# Patient Record
Sex: Female | Born: 1950 | Race: Black or African American | Hispanic: No | Marital: Married | Smoking: Former smoker
Health system: Southern US, Community
[De-identification: ages and names within clinical notes are randomized; demographics above are authoritative.]

## PROBLEM LIST (undated history)

## (undated) HISTORY — PX: BREAST EXCISIONAL BIOPSY: SUR124

---

## 2004-04-01 ENCOUNTER — Ambulatory Visit: Payer: Self-pay | Admitting: Unknown Physician Specialty

## 2004-09-22 ENCOUNTER — Ambulatory Visit: Payer: Self-pay | Admitting: Family Medicine

## 2004-12-13 ENCOUNTER — Ambulatory Visit: Payer: Self-pay | Admitting: Family Medicine

## 2005-11-23 ENCOUNTER — Ambulatory Visit: Payer: Self-pay | Admitting: Family Medicine

## 2006-04-12 ENCOUNTER — Ambulatory Visit: Payer: Self-pay | Admitting: Family Medicine

## 2006-11-28 ENCOUNTER — Ambulatory Visit: Payer: Self-pay

## 2007-11-28 ENCOUNTER — Ambulatory Visit: Payer: Self-pay

## 2008-12-04 ENCOUNTER — Ambulatory Visit: Payer: Self-pay | Admitting: Family Medicine

## 2009-12-16 ENCOUNTER — Ambulatory Visit: Payer: Self-pay

## 2010-12-21 ENCOUNTER — Ambulatory Visit: Payer: Self-pay

## 2011-12-21 ENCOUNTER — Ambulatory Visit: Payer: Self-pay

## 2012-12-24 ENCOUNTER — Ambulatory Visit: Payer: Self-pay

## 2014-01-01 ENCOUNTER — Ambulatory Visit: Payer: Self-pay | Admitting: Nurse Practitioner

## 2014-09-03 ENCOUNTER — Encounter
Admission: RE | Disposition: A | Discharge status: Home / Self Care | Payer: Self-pay | Source: Ambulatory Visit | Attending: Unknown Physician Specialty

## 2014-09-03 ENCOUNTER — Ambulatory Visit: Payer: 59 | Admitting: Anesthesiology

## 2014-09-03 ENCOUNTER — Ambulatory Visit
Admission: RE | Admit: 2014-09-03 | Discharge: 2014-09-03 | Disposition: A | Discharge status: Home / Self Care | Payer: 59 | Source: Ambulatory Visit | Attending: Unknown Physician Specialty | Admitting: Unknown Physician Specialty

## 2014-09-03 ENCOUNTER — Encounter: Payer: Self-pay | Admitting: Anesthesiology

## 2014-09-03 DIAGNOSIS — D125 Benign neoplasm of sigmoid colon: Secondary | ICD-10-CM | POA: Diagnosis not present

## 2014-09-03 DIAGNOSIS — K648 Other hemorrhoids: Secondary | ICD-10-CM | POA: Diagnosis not present

## 2014-09-03 DIAGNOSIS — Z87891 Personal history of nicotine dependence: Secondary | ICD-10-CM | POA: Diagnosis not present

## 2014-09-03 DIAGNOSIS — Z1211 Encounter for screening for malignant neoplasm of colon: Secondary | ICD-10-CM | POA: Insufficient documentation

## 2014-09-03 DIAGNOSIS — D123 Benign neoplasm of transverse colon: Secondary | ICD-10-CM | POA: Insufficient documentation

## 2014-09-03 HISTORY — PX: COLONOSCOPY WITH PROPOFOL: SHX5780

## 2014-09-03 SURGERY — COLONOSCOPY WITH PROPOFOL
Anesthesia: General

## 2014-09-03 MED ORDER — MIDAZOLAM HCL 5 MG/5ML IJ SOLN
INTRAMUSCULAR | Status: DC | PRN
  Administered 2014-09-03: 1 mg via INTRAVENOUS

## 2014-09-03 MED ORDER — PROPOFOL 10 MG/ML IV BOLUS
INTRAVENOUS | Status: DC | PRN
  Administered 2014-09-03: 30 mg via INTRAVENOUS

## 2014-09-03 MED ORDER — SODIUM CHLORIDE 0.9 % IV SOLN: INTRAVENOUS | Status: DC

## 2014-09-03 MED ORDER — PROPOFOL INFUSION 10 MG/ML OPTIME
INTRAVENOUS | Status: DC | PRN
  Administered 2014-09-03: 160 ug/kg/min via INTRAVENOUS

## 2014-09-03 MED ORDER — LIDOCAINE HCL (PF) 2 % IJ SOLN
INTRAMUSCULAR | Status: DC | PRN
  Administered 2014-09-03: 50 mg

## 2014-09-03 MED ORDER — FENTANYL CITRATE (PF) 100 MCG/2ML IJ SOLN
INTRAMUSCULAR | Status: DC | PRN
  Administered 2014-09-03: 50 ug via INTRAVENOUS

## 2014-09-03 MED ORDER — EPHEDRINE SULFATE 50 MG/ML IJ SOLN
INTRAMUSCULAR | Status: DC | PRN
  Administered 2014-09-03: 15 mg via INTRAVENOUS
  Administered 2014-09-03: 10 mg via INTRAVENOUS

## 2014-09-03 MED ORDER — SODIUM CHLORIDE 0.9 % IV SOLN
INTRAVENOUS | Status: DC
  Administered 2014-09-03: 13:00:00 via INTRAVENOUS

## 2014-09-03 NOTE — Anesthesia Postprocedure Evaluation (Signed)
  Anesthesia Post-op Note  Patient: Alisha Henson  Procedure(s) Performed: Procedure(s): COLONOSCOPY WITH PROPOFOL (N/A)  Anesthesia type:General  Patient location: PACU  Post pain: Pain level controlled  Post assessment: Post-op Vital signs reviewed, Patient's Cardiovascular Status Stable, Respiratory Function Stable, Patent Airway and No signs of Nausea or vomiting  Post vital signs: Reviewed and stable  Last Vitals:  Filed Vitals:   09/03/14 1430  BP: 125/57  Pulse: 70  Temp:   Resp: 17    Level of consciousness: awake, alert  and patient cooperative  Complications: No apparent anesthesia complications

## 2014-09-03 NOTE — Anesthesia Preprocedure Evaluation (Signed)
Anesthesia Evaluation  Patient identified by MRN, date of birth, ID band Patient awake    Reviewed: Allergy & Precautions, NPO status , Patient's Chart, lab work & pertinent test results, reviewed documented beta blocker date and time   Airway Mallampati: II  TM Distance: >3 FB     Dental  (+) Chipped   Pulmonary          Cardiovascular     Neuro/Psych    GI/Hepatic   Endo/Other    Renal/GU      Musculoskeletal   Abdominal   Peds  Hematology   Anesthesia Other Findings   Reproductive/Obstetrics                             Anesthesia Physical Anesthesia Plan  ASA: II  Anesthesia Plan: General   Post-op Pain Management:    Induction: Intravenous  Airway Management Planned: Nasal Cannula  Additional Equipment:   Intra-op Plan:   Post-operative Plan:   Informed Consent: I have reviewed the patients History and Physical, chart, labs and discussed the procedure including the risks, benefits and alternatives for the proposed anesthesia with the patient or authorized representative who has indicated his/her understanding and acceptance.     Plan Discussed with: CRNA  Anesthesia Plan Comments:         Anesthesia Quick Evaluation

## 2014-09-03 NOTE — H&P (Signed)
Primary Care Physician:  Marguerita Merles, MD Primary Gastroenterologist:  Dr. Vira Agar  Pre-Procedure History & Physical: HPI:  Alisha Henson is a 63 y.o. female is here for an colonoscopy.   History reviewed. No pertinent past medical history.  History reviewed. No pertinent past surgical history.  Prior to Admission medications   Not on File    Allergies as of 08/06/2014  . (Not on File)    History reviewed. No pertinent family history.  History   Social History  . Marital Status: Married    Spouse Name: N/A  . Number of Children: N/A  . Years of Education: N/A   Occupational History  . Not on file.   Social History Main Topics  . Smoking status: Former Smoker    Quit date: 09/03/1978  . Smokeless tobacco: Not on file  . Alcohol Use: Not on file  . Drug Use: Not on file  . Sexual Activity: Not on file   Other Topics Concern  . Not on file   Social History Narrative  . No narrative on file    Review of Systems: See HPI, otherwise negative ROS  Physical Exam: BP 153/74 mmHg  Pulse 72  Temp(Src) 98 F (36.7 C)  Resp 20  SpO2 100% General:   Alert,  pleasant and cooperative in NAD Head:  Normocephalic and atraumatic. Neck:  Supple; no masses or thyromegaly. Lungs:  Clear throughout to auscultation.    Heart:  Regular rate and rhythm. Abdomen:  Soft, nontender and nondistended. Normal bowel sounds, without guarding, and without rebound.   Neurologic:  Alert and  oriented x4;  grossly normal neurologically.  Impression/Plan: Alisha Henson is here for an colonoscopy to be performed for screening  Risks, benefits, limitations, and alternatives regarding  colonoscopy have been reviewed with the patient.  Questions have been answered.  All parties agreeable.   Gaylyn Cheers, MD  09/03/2014, 1:19 PM   Primary Care Physician:  Marguerita Merles, MD Primary Gastroenterologist:  Dr. Vira Agar  Pre-Procedure History & Physical: HPI:  Alisha Henson is a 64  y.o. female is here for an colonoscopy.   History reviewed. No pertinent past medical history.  History reviewed. No pertinent past surgical history.  Prior to Admission medications   Not on File    Allergies as of 08/06/2014  . (Not on File)    History reviewed. No pertinent family history.  History   Social History  . Marital Status: Married    Spouse Name: N/A  . Number of Children: N/A  . Years of Education: N/A   Occupational History  . Not on file.   Social History Main Topics  . Smoking status: Former Smoker    Quit date: 09/03/1978  . Smokeless tobacco: Not on file  . Alcohol Use: Not on file  . Drug Use: Not on file  . Sexual Activity: Not on file   Other Topics Concern  . Not on file   Social History Narrative  . No narrative on file    Review of Systems: See HPI, otherwise negative ROS  Physical Exam: BP 153/74 mmHg  Pulse 72  Temp(Src) 98 F (36.7 C)  Resp 20  SpO2 100% General:   Alert,  pleasant and cooperative in NAD Head:  Normocephalic and atraumatic. Neck:  Supple; no masses or thyromegaly. Lungs:  Clear throughout to auscultation.    Heart:  Regular rate and rhythm. Abdomen:  Soft, nontender and nondistended. Normal bowel sounds, without guarding, and without  rebound.   Neurologic:  Alert and  oriented x4;  grossly normal neurologically.  Impression/Plan: Alisha Henson is here for an colonoscopy to be performed for screening  Risks, benefits, limitations, and alternatives regarding  colonoscopy have been reviewed with the patient.  Questions have been answered.  All parties agreeable.   Gaylyn Cheers, MD  09/03/2014, 1:19 PM

## 2014-09-03 NOTE — Op Note (Signed)
[facility] Gastroenterology Patient Name: Alisha Henson Procedure Date: 09/03/2014 1:11 PM MRN: [id number] Account #: [id number] Date of Birth: 1950-09-28 Admit Type: Outpatient Age: 64 Room: [facility] ENDO ROOM 4 Gender: Female Note Status: Finalized Procedure:         Colonoscopy Indications:       Screening for colorectal malignant neoplasm Providers:         Manya Silvas, MD Referring MD:      Marguerita Merles, MD (Referring MD) Medicines:         Propofol per Anesthesia Complications:     No immediate complications. Procedure:         Pre-Anesthesia Assessment:                    - After reviewing the risks and benefits, the patient was                     deemed in satisfactory condition to undergo the procedure.                    After obtaining informed consent, the colonoscope was                     passed under direct vision. Throughout the procedure, the                     patient's blood pressure, pulse, and oxygen saturations                     were monitored continuously. The Olympus PCF-H180AL                     colonoscope ( S#: [id number] ) was introduced through the                     anus and advanced to the the cecum, identified by                     appendiceal orifice and ileocecal valve. The colonoscopy                     was performed without difficulty. The patient tolerated                     the procedure well. The quality of the bowel preparation                     was excellent. Findings:      A small polyp was found in the transverse colon. The polyp was sessile.       The polyp was removed with a hot snare. Resection and retrieval were       complete.      A small medium polyp was found in the proximal sigmoid colon. The polyp       was sessile. The polyp was removed with a hot snare. Resection and       retrieval were complete.      Internal hemorrhoids were found. The hemorrhoids were medium-sized.      The exam was  otherwise without abnormality. Impression:        - One small polyp in the transverse colon. Resected and                     retrieved.                    -  One medium polyp in the proximal sigmoid colon. Resected                     and retrieved.                    - Internal hemorrhoids.                    - The examination was otherwise normal. Recommendation:    - Await pathology results. Manya Silvas, MD 09/03/2014 1:54:49 PM This report has been signed electronically. Number of Addenda: 0 Note Initiated On: 09/03/2014 1:11 PM Scope Withdrawal Time: 0 hours 15 minutes 12 seconds  Total Procedure Duration: 0 hours 22 minutes 54 seconds       [facility]

## 2014-09-03 NOTE — Transfer of Care (Signed)
Immediate Anesthesia Transfer of Care Note  Patient: Alisha Henson  Procedure(s) Performed: Procedure(s): COLONOSCOPY WITH PROPOFOL (N/A)  Patient Location: PACU  Anesthesia Type:General  Level of Consciousness: sedated  Airway & Oxygen Therapy: Patient Spontanous Breathing and Patient connected to nasal cannula oxygen  Post-op Assessment: Report given to RN and Post -op Vital signs reviewed and stable  Post vital signs: Reviewed, stable  Last Vitals:  Filed Vitals:   09/03/14 1259  BP: 153/74  Pulse: 72  Temp: 36.7 C  Resp: 20    Complications: No apparent anesthesia complications

## 2014-09-04 LAB — SURGICAL PATHOLOGY

## 2014-09-08 ENCOUNTER — Encounter: Payer: Self-pay | Admitting: Unknown Physician Specialty

## 2014-12-23 ENCOUNTER — Other Ambulatory Visit: Payer: Self-pay | Admitting: Family Medicine

## 2014-12-23 DIAGNOSIS — Z1231 Encounter for screening mammogram for malignant neoplasm of breast: Secondary | ICD-10-CM

## 2015-01-05 ENCOUNTER — Ambulatory Visit
Admission: RE | Admit: 2015-01-05 | Discharge: 2015-01-05 | Disposition: A | Discharge status: Home / Self Care | Payer: Medicare HMO | Source: Ambulatory Visit | Attending: Family Medicine | Admitting: Family Medicine

## 2015-01-05 ENCOUNTER — Other Ambulatory Visit: Payer: Self-pay | Admitting: Family Medicine

## 2015-01-05 DIAGNOSIS — Z1231 Encounter for screening mammogram for malignant neoplasm of breast: Secondary | ICD-10-CM | POA: Insufficient documentation

## 2015-05-14 ENCOUNTER — Other Ambulatory Visit: Payer: Self-pay | Admitting: Nurse Practitioner

## 2015-11-30 ENCOUNTER — Other Ambulatory Visit: Payer: Self-pay | Admitting: Family Medicine

## 2015-11-30 DIAGNOSIS — Z1231 Encounter for screening mammogram for malignant neoplasm of breast: Secondary | ICD-10-CM

## 2016-01-06 ENCOUNTER — Ambulatory Visit: Payer: Medicare HMO

## 2016-02-16 ENCOUNTER — Ambulatory Visit
Admission: RE | Admit: 2016-02-16 | Discharge: 2016-02-16 | Disposition: A | Discharge status: Home / Self Care | Payer: Medicare HMO | Source: Ambulatory Visit | Attending: Family Medicine | Admitting: Family Medicine

## 2016-02-16 DIAGNOSIS — Z1231 Encounter for screening mammogram for malignant neoplasm of breast: Secondary | ICD-10-CM | POA: Diagnosis not present

## 2016-07-12 ENCOUNTER — Other Ambulatory Visit: Payer: Self-pay | Admitting: Family Medicine

## 2016-07-12 DIAGNOSIS — Z13820 Encounter for screening for osteoporosis: Secondary | ICD-10-CM

## 2016-09-01 ENCOUNTER — Other Ambulatory Visit: Payer: Medicare HMO

## 2016-10-24 ENCOUNTER — Ambulatory Visit
Admission: RE | Admit: 2016-10-24 | Discharge: 2016-10-24 | Disposition: A | Discharge status: Home / Self Care | Payer: Medicare HMO | Source: Ambulatory Visit | Attending: Family Medicine | Admitting: Family Medicine

## 2016-10-24 DIAGNOSIS — M8588 Other specified disorders of bone density and structure, other site: Secondary | ICD-10-CM | POA: Diagnosis not present

## 2016-10-24 DIAGNOSIS — Z13820 Encounter for screening for osteoporosis: Secondary | ICD-10-CM | POA: Diagnosis not present

## 2017-02-03 ENCOUNTER — Other Ambulatory Visit: Payer: Self-pay | Admitting: Family Medicine

## 2017-03-28 ENCOUNTER — Other Ambulatory Visit: Payer: Self-pay | Admitting: Family Medicine

## 2017-03-28 DIAGNOSIS — Z1231 Encounter for screening mammogram for malignant neoplasm of breast: Secondary | ICD-10-CM

## 2017-04-14 ENCOUNTER — Ambulatory Visit
Admission: RE | Admit: 2017-04-14 | Discharge: 2017-04-14 | Disposition: A | Discharge status: Home / Self Care | Payer: Medicare HMO | Source: Ambulatory Visit | Attending: Family Medicine | Admitting: Family Medicine

## 2017-04-14 DIAGNOSIS — Z1231 Encounter for screening mammogram for malignant neoplasm of breast: Secondary | ICD-10-CM | POA: Insufficient documentation

## 2017-05-23 ENCOUNTER — Ambulatory Visit
Admission: RE | Admit: 2017-05-23 | Discharge: 2017-05-23 | Disposition: A | Discharge status: Home / Self Care | Payer: Medicare HMO | Source: Ambulatory Visit | Attending: Nurse Practitioner | Admitting: Nurse Practitioner

## 2017-05-23 ENCOUNTER — Other Ambulatory Visit: Payer: Self-pay | Admitting: Nurse Practitioner

## 2017-05-23 DIAGNOSIS — M25562 Pain in left knee: Secondary | ICD-10-CM | POA: Insufficient documentation

## 2018-03-15 ENCOUNTER — Other Ambulatory Visit: Payer: Self-pay | Admitting: Family Medicine

## 2018-03-15 DIAGNOSIS — Z1231 Encounter for screening mammogram for malignant neoplasm of breast: Secondary | ICD-10-CM

## 2018-07-24 ENCOUNTER — Ambulatory Visit
Admission: RE | Admit: 2018-07-24 | Discharge: 2018-07-24 | Disposition: A | Discharge status: Home / Self Care | Payer: Medicare HMO | Source: Ambulatory Visit | Attending: Family Medicine | Admitting: Family Medicine

## 2018-07-24 ENCOUNTER — Other Ambulatory Visit: Payer: Self-pay

## 2018-07-24 DIAGNOSIS — Z1231 Encounter for screening mammogram for malignant neoplasm of breast: Secondary | ICD-10-CM | POA: Diagnosis not present

## 2019-03-27 ENCOUNTER — Other Ambulatory Visit: Payer: Self-pay

## 2019-03-27 ENCOUNTER — Ambulatory Visit: Payer: Medicare HMO | Admitting: Urology

## 2019-03-27 ENCOUNTER — Encounter: Payer: Self-pay | Admitting: Urology

## 2019-03-27 VITALS — BP 155/84 | HR 92 | Ht 66.0 in | Wt 212.0 lb

## 2019-03-27 DIAGNOSIS — R350 Frequency of micturition: Secondary | ICD-10-CM

## 2019-03-27 DIAGNOSIS — R3129 Other microscopic hematuria: Secondary | ICD-10-CM | POA: Diagnosis not present

## 2019-03-27 LAB — URINALYSIS, COMPLETE
Bilirubin, UA: NEGATIVE
Glucose, UA: NEGATIVE
Leukocytes,UA: NEGATIVE
Nitrite, UA: NEGATIVE
Protein,UA: NEGATIVE
Specific Gravity, UA: 1.025 (ref 1.005–1.030)
Urobilinogen, Ur: 0.2 mg/dL (ref 0.2–1.0)
pH, UA: 5.5 (ref 5.0–7.5)

## 2019-03-27 LAB — MICROSCOPIC EXAMINATION: Bacteria, UA: NONE SEEN

## 2019-03-27 LAB — BLADDER SCAN AMB NON-IMAGING: SCA Result: 0

## 2019-03-27 MED ORDER — OXYBUTYNIN CHLORIDE ER 10 MG PO TB24: 10.0000 mg | ORAL_TABLET | Freq: Every day | ORAL | 1 refills | Status: DC

## 2019-03-27 NOTE — Progress Notes (Signed)
03/27/2019 4:56 PM   Alisha Henson 01-24-1951 [id number]  Referring provider: Bunnie Pion, [place],  [place] [address]  Chief Complaint  Patient presents with  . Urinary Frequency    HPI: Alisha Henson is a 68 y.o. female seen at the request of Hendricks Milo, FNP for evaluation of lower urinary tract symptoms and microhematuria.  She was seen on 02/26/2019 complaining of a several month history of urinary frequency and nocturia x3.  She also complained of urgency with occasional episodes of urge incontinence and mild stress incontinence.  She states she was diagnosed with overactive bladder several years ago but had never been treated.  Her urinalysis showed moderate blood but no microscopy was performed.  She was started on extended release oxybutynin 5 mg daily and states this has not improved her symptoms.  Denies gross hematuria.  She has mild suprapubic discomfort.  Denies chronic constipation or diarrhea.  No urologic history including CVA, Parkinson's or MS.   PMH: No past medical history on file.  Surgical History: Past Surgical History:  Procedure Laterality Date  . BREAST EXCISIONAL BIOPSY Right 20+ yrs ago   neg  . COLONOSCOPY WITH PROPOFOL N/A 09/03/2014   Procedure: COLONOSCOPY WITH PROPOFOL;  Surgeon: Manya Silvas, MD;  Location: [facility] ENDOSCOPY;  Service: Endoscopy;  Laterality: N/A;    Home Medications:  Allergies as of 03/27/2019   No Known Allergies     Medication List       Accurate as of March 27, 2019  4:56 PM. If you have any questions, ask your nurse or doctor.        amLODipine 10 MG tablet Commonly known as: NORVASC   fluticasone 50 MCG/ACT nasal spray Commonly known as: FLONASE   lovastatin 20 MG tablet Commonly known as: MEVACOR   oxybutynin 5 MG tablet Commonly known as: DITROPAN       Allergies: No Known Allergies  Family History: Family History  Problem Relation Age of Onset  . Breast cancer Neg  Hx     Social History:  reports that she quit smoking about 40 years ago. She has never used smokeless tobacco. She reports that she does not drink alcohol. No history on file for drug.  ROS: UROLOGY Frequent Urination?: No Hard to postpone urination?: No Burning/pain with urination?: No Get up at night to urinate?: Yes Leakage of urine?: Yes Urine stream starts and stops?: Yes Trouble starting stream?: No Do you have to strain to urinate?: No Blood in urine?: Yes Urinary tract infection?: Yes Sexually transmitted disease?: No Injury to kidneys or bladder?: No Painful intercourse?: No Weak stream?: No Currently pregnant?: No Vaginal bleeding?: No Last menstrual period?: n  Gastrointestinal Nausea?: No Vomiting?: No Indigestion/heartburn?: No Diarrhea?: No Constipation?: No  Constitutional Fever: No Night sweats?: No Weight loss?: No Fatigue?: No  Skin Skin rash/lesions?: No Itching?: No  Eyes Blurred vision?: No Double vision?: No  Ears/Nose/Throat Sore throat?: No Sinus problems?: No  Hematologic/Lymphatic Swollen glands?: No Easy bruising?: No  Cardiovascular Leg swelling?: No Chest pain?: No  Respiratory Cough?: No Shortness of breath?: No  Endocrine Excessive thirst?: No  Musculoskeletal Back pain?: No Joint pain?: No  Neurological Headaches?: No Dizziness?: No  Psychologic Depression?: No Anxiety?: No  Physical Exam: BP (!) 155/84   Pulse 92   Ht 5\' 6"  (1.676 m)   Wt 212 lb (96.2 kg)   BMI 34.22 kg/m   Constitutional:  Alert and oriented, No acute distress.  HEENT: [place] AT, moist mucus membranes.  Trachea midline, no masses. Cardiovascular: No clubbing, cyanosis, or edema. Respiratory: Normal respiratory effort, no increased work of breathing. GI: Abdomen is soft, mild suprapubic tenderness, nondistended, no abdominal masses GU: No CVA tenderness Lymph: No cervical or inguinal lymphadenopathy. Skin: No rashes, bruises or  suspicious lesions. Neurologic: Grossly intact, no focal deficits, moving all 4 extremities. Psychiatric: Normal mood and affect.  Laboratory Data:  Urinalysis Dipstick 1+ blood Microscopy 3-10 RBC  Assessment & Plan:    - Overactive bladder with urge incontinence Will increase oxybutynin to 10 mg  - Microhematuria Microscopic exam today does show clinically significant microhematuria.  Based on age she does have high risk microhematuria.  We discussed the recommended evaluation including CT urogram and cystoscopy.  She desires to proceed    Abbie Sons, MD  [place] [address] [place], [place] [address] [phone]

## 2019-03-29 ENCOUNTER — Encounter: Payer: Self-pay | Admitting: Urology

## 2019-04-17 ENCOUNTER — Ambulatory Visit
Admission: RE | Admit: 2019-04-17 | Discharge: 2019-04-17 | Disposition: A | Discharge status: Home / Self Care | Payer: Medicare HMO | Source: Ambulatory Visit | Attending: Urology | Admitting: Urology

## 2019-04-17 ENCOUNTER — Other Ambulatory Visit: Payer: Self-pay

## 2019-04-17 DIAGNOSIS — R3129 Other microscopic hematuria: Secondary | ICD-10-CM | POA: Diagnosis not present

## 2019-04-17 LAB — POCT I-STAT CREATININE: Creatinine, Ser: 0.8 mg/dL (ref 0.44–1.00)

## 2019-04-17 MED ORDER — IOHEXOL 300 MG/ML  SOLN
125.0000 mL | Freq: Once | INTRAMUSCULAR | Status: AC | PRN
  Administered 2019-04-17: 125 mL via INTRAVENOUS

## 2019-04-19 ENCOUNTER — Other Ambulatory Visit: Payer: Self-pay

## 2019-04-19 ENCOUNTER — Ambulatory Visit: Payer: Medicare HMO | Admitting: Urology

## 2019-04-19 ENCOUNTER — Encounter: Payer: Self-pay | Admitting: Urology

## 2019-04-19 VITALS — BP 144/78 | HR 102 | Ht 66.0 in | Wt 212.0 lb

## 2019-04-19 DIAGNOSIS — R3129 Other microscopic hematuria: Secondary | ICD-10-CM | POA: Diagnosis not present

## 2019-04-19 LAB — MICROSCOPIC EXAMINATION
Bacteria, UA: NONE SEEN
Epithelial Cells (non renal): NONE SEEN /hpf (ref 0–10)

## 2019-04-19 LAB — URINALYSIS, COMPLETE
Bilirubin, UA: NEGATIVE
Glucose, UA: NEGATIVE
Ketones, UA: NEGATIVE
Leukocytes,UA: NEGATIVE
Nitrite, UA: NEGATIVE
Protein,UA: NEGATIVE
Specific Gravity, UA: >1.03 — ABNORMAL HIGH (ref 1.005–1.030)
Urobilinogen, Ur: 0.2 mg/dL (ref 0.2–1.0)
pH, UA: 6.5 (ref 5.0–7.5)

## 2019-04-19 LAB — BLADDER SCAN AMB NON-IMAGING: SCA Result: 0

## 2019-04-19 NOTE — Progress Notes (Signed)
   04/19/19  CC:  Chief Complaint  Patient presents with  . Cysto   Indications: Microhematuria   HPI: 69 y.o. female seen 03/27/2019 for lower urinary tract symptoms and microhematuria.  She had been on oxybutynin 5 mg which was increased to 10 mg for OAB symptoms however could not tolerate secondary to dryness.  She did no improvement in her symptoms on the higher dose.  CTU performed on 04/17/2019 showed no upper tract abnormalities.  On my review there was fullness of the renal pelvis with bladder fullness.  PVR by bladder scan today was 0 mL.   Blood pressure (!) 144/78, pulse (!) 102, height 5\' 6"  (1.676 m), weight 212 lb (96.2 kg). NED. A&Ox3.   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  Lidocaine jelly was administered per urethral meatus.    Procedure: - Flexible cystoscope introduced, without any difficulty.   - Thorough search of the bladder revealed:    normal urethral meatus    normal urothelium    no stones    no ulcers     no tumors    no urethral polyps    no trabeculation  - Ureteral orifices were normal in position and appearance.  Post-Procedure: - Patient tolerated the procedure well  Assessment/ Plan: -No upper tract abnormalities CTU -No lower tract abnormalities on cystoscopy -Trial Toviaz 4 mg daily for OAB symptoms -Instructed to discontinue and call if she has side effects -Follow-up 6 months   Abbie Sons, MD

## 2019-05-09 ENCOUNTER — Other Ambulatory Visit: Payer: Self-pay

## 2019-05-09 MED ORDER — FESOTERODINE FUMARATE ER 4 MG PO TB24: 4.0000 mg | ORAL_TABLET | Freq: Every day | ORAL | 11 refills | Status: DC

## 2019-05-09 NOTE — Telephone Encounter (Signed)
Patient called stating that toviaz samples are working well for her she requested a script be sent to her pharmacy

## 2019-06-13 ENCOUNTER — Telehealth: Payer: Self-pay

## 2019-06-13 MED ORDER — MIRABEGRON ER 25 MG PO TB24: 25.0000 mg | ORAL_TABLET | Freq: Every day | ORAL | 1 refills | Status: DC

## 2019-06-13 NOTE — Telephone Encounter (Signed)
Patient called stating that the Toviaz samples are causing her to be constipated and she is having bad dry mouth. Can another medication be sent in?

## 2019-06-13 NOTE — Telephone Encounter (Signed)
Patient notified, she states she thought about it more and would like to hold off on trying any additional medication. She states she will let us know if she changes her mind

## 2019-06-13 NOTE — Telephone Encounter (Signed)
It looks like Myrbetriq is on her formulary.  Rx was sent

## 2019-06-28 ENCOUNTER — Other Ambulatory Visit: Payer: Self-pay | Admitting: Family Medicine

## 2019-06-28 DIAGNOSIS — Z1231 Encounter for screening mammogram for malignant neoplasm of breast: Secondary | ICD-10-CM

## 2019-07-25 ENCOUNTER — Ambulatory Visit
Admission: RE | Admit: 2019-07-25 | Discharge: 2019-07-25 | Disposition: A | Discharge status: Home / Self Care | Payer: Medicare HMO | Source: Ambulatory Visit | Attending: Family Medicine | Admitting: Family Medicine

## 2019-07-25 DIAGNOSIS — Z1231 Encounter for screening mammogram for malignant neoplasm of breast: Secondary | ICD-10-CM | POA: Diagnosis not present

## 2019-08-07 ENCOUNTER — Other Ambulatory Visit: Payer: Self-pay

## 2019-08-07 ENCOUNTER — Ambulatory Visit (INDEPENDENT_AMBULATORY_CARE_PROVIDER_SITE_OTHER): Payer: Medicare HMO | Admitting: Urology

## 2019-08-07 ENCOUNTER — Encounter: Payer: Self-pay | Admitting: Urology

## 2019-08-07 VITALS — BP 158/65 | HR 93 | Ht 66.0 in | Wt 200.0 lb

## 2019-08-07 DIAGNOSIS — R3129 Other microscopic hematuria: Secondary | ICD-10-CM | POA: Diagnosis not present

## 2019-08-07 NOTE — Progress Notes (Signed)
   08/07/2019 1:47 PM   Alisha Henson Oct 04, 1950 [id number]  Referring provider: Marguerita Merles, [place],  [place] [address] Chief Complaint  Patient presents with  . Urinary Frequency    HPI: Alisha Henson is a 69 y.o. female who presents today for a follow up. -Follow up for overactive bladder. She is no long on Toviaz or myrbetriq - Complains of frequency, urgency, nocturia x3 and dribbling at the end of urination -Denies dysuria, gross hematuria -Denies flank, abdominal or pelvic pain -Prior negative microscopic hematuria evaluation   PMH: History reviewed. No pertinent past medical history.  Surgical History: Past Surgical History:  Procedure Laterality Date  . BREAST EXCISIONAL BIOPSY Right 20+ yrs ago   neg  . COLONOSCOPY WITH PROPOFOL N/A 09/03/2014   Procedure: COLONOSCOPY WITH PROPOFOL;  Surgeon: Manya Silvas, MD;  Location: [facility] ENDOSCOPY;  Service: Endoscopy;  Laterality: N/A;    Home Medications:  Allergies as of 08/07/2019   No Known Allergies     Medication List       Accurate as of August 07, 2019  1:47 PM. If you have any questions, ask your nurse or doctor.        amLODipine 10 MG tablet Commonly known as: NORVASC   fesoterodine 4 MG Tb24 tablet Commonly known as: TOVIAZ Take 1 tablet (4 mg total) by mouth daily.   fluticasone 50 MCG/ACT nasal spray Commonly known as: FLONASE   lovastatin 20 MG tablet Commonly known as: MEVACOR   mirabegron ER 25 MG Tb24 tablet Commonly known as: MYRBETRIQ Take 1 tablet (25 mg total) by mouth daily.   Premarin vaginal cream Generic drug: conjugated estrogens       Allergies: No Known Allergies  Family History: Family History  Problem Relation Age of Onset  . Breast cancer Neg Hx     Social History:  reports that she quit smoking about 40 years ago. She has never used smokeless tobacco. She reports that she does not drink alcohol. No history on file for drug  use.   Physical Exam: BP (!) 158/65   Pulse 93   Ht 5\' 6"  (1.676 m)   Wt 200 lb (90.7 kg)   BMI 32.28 kg/m   Constitutional:  Alert and oriented, No acute distress. HEENT: [place] AT, moist mucus membranes.  Trachea midline, no masses. Cardiovascular: No clubbing, cyanosis, or edema. Respiratory: Normal respiratory effort, no increased work of breathing. Skin: No rashes, bruises or suspicious lesions. Neurologic: Grossly intact, no focal deficits, moving all 4 extremities. Psychiatric: Normal mood and affect.   Assessment & Plan:    1.  Overactive bladder -Currently on no medications -Complained of dryness with anticholinergics. She does not remember if myrbetriq 25 mg was effective. She was given myrbetriq samples 50 mg and  Will call back in 1 month regarding efficacy.  -If she is not improving, she was also given literature on [place] [address], [place], [place] [address] [phone]  I, Joneen Boers Peace, am acting as a Education administrator for Dr. Nicki Reaper C. Zebastian Carico.  I have reviewed the above documentation for accuracy and completeness, and I agree with the above.   Abbie Sons, MD

## 2019-08-08 LAB — URINALYSIS, COMPLETE
Bilirubin, UA: NEGATIVE
Glucose, UA: NEGATIVE
Ketones, UA: NEGATIVE
Leukocytes,UA: NEGATIVE
Nitrite, UA: NEGATIVE
Protein,UA: NEGATIVE
Specific Gravity, UA: <1.005 — ABNORMAL LOW (ref 1.005–1.030)
Urobilinogen, Ur: 0.2 mg/dL (ref 0.2–1.0)
pH, UA: 5.5 (ref 5.0–7.5)

## 2019-08-08 LAB — MICROSCOPIC EXAMINATION

## 2019-08-09 ENCOUNTER — Telehealth: Payer: Self-pay | Admitting: *Deleted

## 2019-08-09 NOTE — Telephone Encounter (Signed)
Notified patient as instructed, patient pleased. Discussed follow-up appointments, patient agrees  

## 2019-08-09 NOTE — Telephone Encounter (Signed)
-----   Message from Abbie Sons, MD sent at 08/09/2019  8:14 AM EDT ----- Can you let patient know urinalysis was normal

## 2019-09-03 ENCOUNTER — Other Ambulatory Visit: Payer: Self-pay | Admitting: *Deleted

## 2019-09-03 MED ORDER — MIRABEGRON ER 25 MG PO TB24: 25.0000 mg | ORAL_TABLET | Freq: Every day | ORAL | 1 refills | Status: DC

## 2019-09-03 NOTE — Telephone Encounter (Signed)
Patient called to give an update regarding Myrbetriq. States it has been improving her symptoms. Refill sent to pharmacy as requested.

## 2019-09-04 ENCOUNTER — Other Ambulatory Visit: Payer: Self-pay | Admitting: *Deleted

## 2019-09-04 MED ORDER — MIRABEGRON ER 50 MG PO TB24: 50.0000 mg | ORAL_TABLET | Freq: Every day | ORAL | 11 refills | Status: DC

## 2019-10-23 ENCOUNTER — Encounter: Payer: Self-pay | Admitting: Urology

## 2019-10-23 ENCOUNTER — Ambulatory Visit: Payer: Self-pay | Admitting: Urology

## 2019-10-24 ENCOUNTER — Telehealth: Payer: Self-pay

## 2019-10-24 NOTE — Telephone Encounter (Signed)
Patient called today stating that she is having a dry sore throat and some sinus issues along with constipation. She believes this is a side effect of the Myrbetriq. It was explained that Myrberiq is not an anticholingeric and it should not cause these types of side effects. Other bladder medications that she has tried previously have done this but they Myrbetriq works differently and should not. She was instructed to follow up with PCP regarding symptoms. Patient states she does have bad allergies this time of year

## 2019-10-25 ENCOUNTER — Telehealth: Payer: Self-pay

## 2019-10-25 NOTE — Telephone Encounter (Signed)
There are no other alternatives.  She could not tolerate anticholinergic medications and [place] will not be covered by her insurance and will be cost prohibitive.  I gave her literature on PTNS and if she is interested can see if insurance will approve.  If she desires can schedule an appointment with Dr. Matilde Sprang to discuss other second line options

## 2019-10-25 NOTE — Telephone Encounter (Signed)
Incoming call from [facility] stating that patient has has sore throat and headache since beginning Myrbetriq. They advised pt to d/c the medication, they are requesting alternative medications. Please advise.

## 2019-10-25 NOTE — Telephone Encounter (Signed)
Talked with patient and her husband as instructed . She will call us back when she is ready to make a decision.

## 2019-12-31 NOTE — Telephone Encounter (Signed)
The medication was sent March 2021 and had 11 refills.  There should be plenty of refills left

## 2019-12-31 NOTE — Telephone Encounter (Signed)
Notified patient as instructed, Left message on vm

## 2019-12-31 NOTE — Addendum Note (Signed)
Addended by: John Giovanni C on: 12/31/2019 04:00 PM   Modules accepted: Orders

## 2019-12-31 NOTE — Telephone Encounter (Signed)
Pt calling stating she would like to start the myrbetriq back again, per pt she's urinating on herself and getting up several times at night. I advised pt that she was having sore throat and headache and per pt " it was only a little and I got to do something" pt states she will start back and call if any " bad " problems.

## 2020-01-07 ENCOUNTER — Other Ambulatory Visit: Payer: Self-pay | Admitting: *Deleted

## 2020-01-07 MED ORDER — MIRABEGRON ER 50 MG PO TB24: 50.0000 mg | ORAL_TABLET | Freq: Every day | ORAL | 1 refills | Status: DC

## 2020-03-30 ENCOUNTER — Telehealth: Payer: Self-pay | Admitting: Family Medicine

## 2020-03-30 NOTE — Telephone Encounter (Signed)
Patient called and states she is having vaginal discharge and odor and itching. I informed her it sounds like a yeast infection and to contact her PCP to have it evaluated. She states she will call her PCP today.  Patient also states she was given Myrbetriq and it is to expensive and would like to try another medication that may be more effective. Please advise

## 2020-03-31 MED ORDER — OXYBUTYNIN CHLORIDE ER 10 MG PO TB24: 10.0000 mg | ORAL_TABLET | Freq: Every day | ORAL | 3 refills | Status: DC

## 2020-03-31 NOTE — Telephone Encounter (Signed)
Patient notified

## 2020-03-31 NOTE — Telephone Encounter (Signed)
She was previously on oxybutynin then Toviaz.  Or either of these medications effective?  If not she may want to consider PTNS

## 2020-03-31 NOTE — Telephone Encounter (Signed)
Patient notified and would like to try the Oxybutynin again.

## 2020-03-31 NOTE — Telephone Encounter (Signed)
Rx sent.  [facility] listed as her pharmacy

## 2020-04-01 NOTE — Telephone Encounter (Signed)
Patient called and left a voicemail asking for a call back. She states she cannot remember what she was told she could take to help with her dry mouth. Please advise?

## 2020-04-02 NOTE — Telephone Encounter (Signed)
There are several over-the-counter mouthwashes for dry mouth. I would have her ask her pharmacist per [facility]. Notified patient as instructed, patient will talk with her Pharmacist

## 2020-05-04 NOTE — Telephone Encounter (Signed)
Patient called stating that she could not tolerate the side effect of dryness from the oxybutinin. She has stopped the medication. She states she will think about the PTNS option or save up for the Myrbetriq and call us back with her decision

## 2020-06-15 ENCOUNTER — Other Ambulatory Visit: Payer: Self-pay | Admitting: Family Medicine

## 2020-06-15 DIAGNOSIS — Z1231 Encounter for screening mammogram for malignant neoplasm of breast: Secondary | ICD-10-CM

## 2020-07-27 ENCOUNTER — Ambulatory Visit: Payer: Self-pay | Admitting: Physician Assistant

## 2020-07-27 ENCOUNTER — Ambulatory Visit
Admission: RE | Admit: 2020-07-27 | Discharge: 2020-07-27 | Disposition: A | Discharge status: Home / Self Care | Payer: Medicare HMO | Source: Ambulatory Visit | Attending: Family Medicine | Admitting: Family Medicine

## 2020-07-27 ENCOUNTER — Other Ambulatory Visit: Payer: Self-pay

## 2020-07-27 DIAGNOSIS — Z1231 Encounter for screening mammogram for malignant neoplasm of breast: Secondary | ICD-10-CM | POA: Insufficient documentation

## 2020-07-29 ENCOUNTER — Ambulatory Visit: Payer: Self-pay | Admitting: Physician Assistant

## 2020-10-12 ENCOUNTER — Other Ambulatory Visit: Payer: Self-pay

## 2020-10-12 ENCOUNTER — Ambulatory Visit (INDEPENDENT_AMBULATORY_CARE_PROVIDER_SITE_OTHER): Payer: Medicare HMO | Admitting: Physician Assistant

## 2020-10-12 ENCOUNTER — Encounter: Payer: Self-pay | Admitting: Physician Assistant

## 2020-10-12 VITALS — BP 146/76 | HR 88 | Ht 66.0 in | Wt 211.0 lb

## 2020-10-12 DIAGNOSIS — R350 Frequency of micturition: Secondary | ICD-10-CM

## 2020-10-12 LAB — BLADDER SCAN AMB NON-IMAGING

## 2020-10-12 MED ORDER — MIRABEGRON ER 50 MG PO TB24: 50.0000 mg | ORAL_TABLET | Freq: Every day | ORAL | 11 refills | Status: DC

## 2020-10-12 NOTE — Progress Notes (Signed)
10/12/2020 4:24 PM   Alisha Henson October 12, 1950 [id number]  CC: Chief Complaint  Patient presents with   Medication Refill   HPI: Alisha Henson is a 70 y.o. female with PMH OAB wet who previously failed anticholinergics and reported sore throat and headache on Myrbetriq who presents today for symptom recheck and medication refill of Myrbetriq.   Today she reports having restarted Myrbetriq since her last clinic visit.  This time, she states she has not experienced any headache or sore throat.  She reports improved urinary frequency, now every 45 to 60 minutes, previously every 30 minutes.  She also reports improved nocturia, now x3, previously x4.  She continues to report some postvoid dribbling that is bothersome for her and wonders why this might be occurring.  PVR 9 mL.  PMH: No past medical history on file.  Surgical History: Past Surgical History:  Procedure Laterality Date   BREAST EXCISIONAL BIOPSY Right 20+ yrs ago   neg   COLONOSCOPY WITH PROPOFOL N/A 09/03/2014   Procedure: COLONOSCOPY WITH PROPOFOL;  Surgeon: Manya Silvas, MD;  Location: [facility] ENDOSCOPY;  Service: Endoscopy;  Laterality: N/A;    Home Medications:  Allergies as of 10/12/2020   No Known Allergies      Medication List        Accurate as of October 12, 2020  4:24 PM. If you have any questions, ask your nurse or doctor.          amLODipine 10 MG tablet Commonly known as: NORVASC   fluticasone 50 MCG/ACT nasal spray Commonly known as: FLONASE   lovastatin 20 MG tablet Commonly known as: MEVACOR   Myrbetriq 50 MG Tb24 tablet Generic drug: mirabegron ER Take 50 mg by mouth daily.   oxybutynin 10 MG 24 hr tablet Commonly known as: DITROPAN-XL Take 1 tablet (10 mg total) by mouth daily.   Premarin vaginal cream Generic drug: conjugated estrogens        Allergies:  No Known Allergies  Family History: Family History  Problem Relation Age of Onset   Breast cancer Neg Hx      Social History:   reports that she quit smoking about 42 years ago. Her smoking use included cigarettes. She has never used smokeless tobacco. She reports that she does not drink alcohol. No history on file for drug use.  Physical Exam: BP (!) 146/76   Pulse 88   Ht '5\' 6"'$  (1.676 m)   Wt 211 lb (95.7 kg)   BMI 34.06 kg/m   Constitutional:  Alert and oriented, no acute distress, nontoxic appearing HEENT: [place], AT Cardiovascular: No clubbing, cyanosis, or edema Respiratory: Normal respiratory effort, no increased work of breathing Skin: No rashes, bruises or suspicious lesions Neurologic: Grossly intact, no focal deficits, moving all 4 extremities Psychiatric: Normal mood and affect  Laboratory Data: Results for orders placed or performed in visit on 10/12/20  Bladder Scan (Post Void Residual) in office  Result Value Ref Range   Scan Result 47m    Assessment & Plan:   1. Urinary frequency Symptomatic improvement on Myrbetriq and she appears to be tolerating this medication well now.  We will refill for 1 year.  I offered her pelvic floor PT today to assist with her postvoid dribbling, which she declined.  We also discussed consideration of PTNS in the future if she is not at her treatment goal, but she prefers to defer this. - Bladder Scan (Post Void Residual) in office - mirabegron ER (MYRBETRIQ)  50 MG TB24 tablet; Take 1 tablet (50 mg total) by mouth daily.  Dispense: 30 tablet; Refill: 11   Return in about 1 year (around 10/12/2021) for Annual OAB f/u.  Debroah Loop, PA-C  [facility] [address], [place], [place] [address] [phone]

## 2021-05-31 ENCOUNTER — Other Ambulatory Visit: Payer: Self-pay | Admitting: Family Medicine

## 2021-05-31 DIAGNOSIS — Z1231 Encounter for screening mammogram for malignant neoplasm of breast: Secondary | ICD-10-CM

## 2021-07-28 ENCOUNTER — Ambulatory Visit
Admission: RE | Admit: 2021-07-28 | Discharge: 2021-07-28 | Disposition: A | Discharge status: Home / Self Care | Payer: Medicare HMO | Source: Ambulatory Visit | Attending: Family Medicine | Admitting: Family Medicine

## 2021-07-28 DIAGNOSIS — Z1231 Encounter for screening mammogram for malignant neoplasm of breast: Secondary | ICD-10-CM | POA: Insufficient documentation

## 2021-10-12 ENCOUNTER — Ambulatory Visit: Payer: Medicare HMO | Admitting: Physician Assistant

## 2021-10-21 ENCOUNTER — Ambulatory Visit: Payer: Medicare HMO | Admitting: Physician Assistant

## 2021-11-22 ENCOUNTER — Ambulatory Visit (INDEPENDENT_AMBULATORY_CARE_PROVIDER_SITE_OTHER): Payer: Medicare HMO | Admitting: Physician Assistant

## 2021-11-22 VITALS — BP 151/78 | HR 87 | Ht 66.0 in | Wt 213.0 lb

## 2021-11-22 DIAGNOSIS — R3129 Other microscopic hematuria: Secondary | ICD-10-CM | POA: Diagnosis not present

## 2021-11-22 DIAGNOSIS — R350 Frequency of micturition: Secondary | ICD-10-CM | POA: Diagnosis not present

## 2021-11-22 LAB — BLADDER SCAN AMB NON-IMAGING: Scan Result: 61

## 2021-11-22 MED ORDER — MIRABEGRON ER 50 MG PO TB24: 50.0000 mg | ORAL_TABLET | Freq: Every day | ORAL | 11 refills | Status: DC

## 2021-11-22 NOTE — Progress Notes (Signed)
11/22/2021 9:18 AM   Alisha Henson [birth year] [id number]  CC: Chief Complaint  Patient presents with   Follow-up    1 year follow up    HPI: Alisha Henson is a 71 y.o. female with PMH microscopic hematuria with benign workup in 2021 and OAB wet who previously failed anticholinergics on Myrbetriq 50 mg daily who presents today for annual follow-up.   At her last visit she reported daytime frequency every 45 to 60 minutes, nocturia x3, and postvoid dribbling.  Today she reports she stopped taking Myrbetriq 3 to 4 months ago due to cost.  It is $45 per month with her insurance.  She continues to report daytime hesitancy, urgency, urge incontinence, frequency every 1-2 hours, stress incontinence, and nocturia x3.  She is most bothered by nocturia. PVR 9m.  PMH: No past medical history on file.  Surgical History: Past Surgical History:  Procedure Laterality Date   BREAST EXCISIONAL BIOPSY Right 20+ yrs ago   neg   COLONOSCOPY WITH PROPOFOL N/A 09/03/2014   Procedure: COLONOSCOPY WITH PROPOFOL;  Surgeon: RManya Silvas MD;  [facility];  Service: Endoscopy;  Laterality: N/A;    Home Medications:  Allergies as of 11/22/2021   No Known Allergies      Medication List        Accurate as of November 22, 2021  9:18 AM. If you have any questions, ask your nurse or doctor.          amLODipine 10 MG tablet Commonly known as: NORVASC   cetirizine 10 MG tablet Commonly known as: ZYRTEC Take 10 mg by mouth daily.   fluticasone 50 MCG/ACT nasal spray Commonly known as: FLONASE   lovastatin 20 MG tablet Commonly known as: MEVACOR   mirabegron ER 50 MG Tb24 tablet Commonly known as: Myrbetriq Take 1 tablet (50 mg total) by mouth daily.   naproxen 500 MG tablet Commonly known as: NAPROSYN Take 1 tablet twice a day by oral route.   Premarin vaginal cream Generic drug: conjugated estrogens        Allergies:  No Known Allergies  Family  History: Family History  Problem Relation Age of Onset   Breast cancer Neg Hx     Social History:   reports that she quit smoking about 43 years ago. Her smoking use included cigarettes. She has never used smokeless tobacco. She reports that she does not drink alcohol. No history on file for drug use.  Physical Exam: BP (!) 151/78   Pulse 87   Ht '5\' 6"'$  (1.676 m)   Wt 213 lb (96.6 kg)   BMI 34.38 kg/m   Constitutional:  Alert and oriented, no acute distress, nontoxic appearing HEENT: [place], AT Cardiovascular: No clubbing, cyanosis, or edema Respiratory: Normal respiratory effort, no increased work of breathing Skin: No rashes, bruises or suspicious lesions Neurologic: Grossly intact, no focal deficits, moving all 4 extremities Psychiatric: Normal mood and affect  Laboratory Data: Results for orders placed or performed in visit on 11/22/21  Bladder Scan (Post Void Residual) in office  Result Value Ref Range   Scan Result 61    Assessment & Plan:   1. Urinary frequency OAB wet with mixed urge and stress incontinence as well as hesitancy previously well managed on Myrbetriq, which she stopped due to cost.  We discussed alternative therapies including pelvic floor physical therapy and PTNS, however with co-pays both of these options may also be cost prohibitive for her.  Unfortunately, she was  unable to take anticholinergics.  After lengthy conversation, she decided that she would like to resume Myrbetriq.  I have given her the cost savings hotline number for [employer] to see if she qualifies for anything through them.  I also gave her written information about Kegel exercises.  Alternatively, we discussed contacting her insurance to see if there would be a co-pay with PTNS, as that would be our next step if she fails Myrbetriq. - Bladder Scan (Post Void Residual) in office - mirabegron ER (MYRBETRIQ) 50 MG TB24 tablet; Take 1 tablet (50 mg total) by mouth daily.  Dispense: 30 tablet;  Refill: 11  2. Microscopic hematuria No significant symptoms in the past year.  We will continue to monitor.  Return in about 1 year (around 11/23/2022) for Annual f/u with UA, PVR.  Debroah Loop, PA-C  [facility] [address] [place], [place] [address] [phone]

## 2021-11-22 NOTE — Patient Instructions (Addendum)
For more information about reducing the cost of Myrbetriq, please contact the [employer] line at [phone]. This is a service provided by the makers of the drug; they can help explain how you can reduce the cost of this medication based on your particular circumstances. They are available Monday-Friday, 9:00 am-8:00 pm ET.   If Myrbetriq is too expensive, our next step will be to call your insurance and see if there would be a copay for you to pursue PTNS treatments.

## 2022-06-13 ENCOUNTER — Other Ambulatory Visit: Payer: Self-pay | Admitting: Family Medicine

## 2022-06-13 DIAGNOSIS — Z1231 Encounter for screening mammogram for malignant neoplasm of breast: Secondary | ICD-10-CM

## 2022-06-14 IMAGING — MG MM DIGITAL SCREENING BILAT W/ TOMO AND CAD
6 of 10 series · 6 of 30 positions shown · non-contrast
Comparison: Previous exam(s).

CLINICAL DATA: Screening.

EXAM:
DIGITAL SCREENING BILATERAL MAMMOGRAM WITH TOMOSYNTHESIS AND CAD
TECHNIQUE: Bilateral screening digital craniocaudal and mediolateral oblique
mammograms were obtained. Bilateral screening digital breast
tomosynthesis was performed. The images were evaluated with
computer-aided detection.

[R MLO synth-2D]
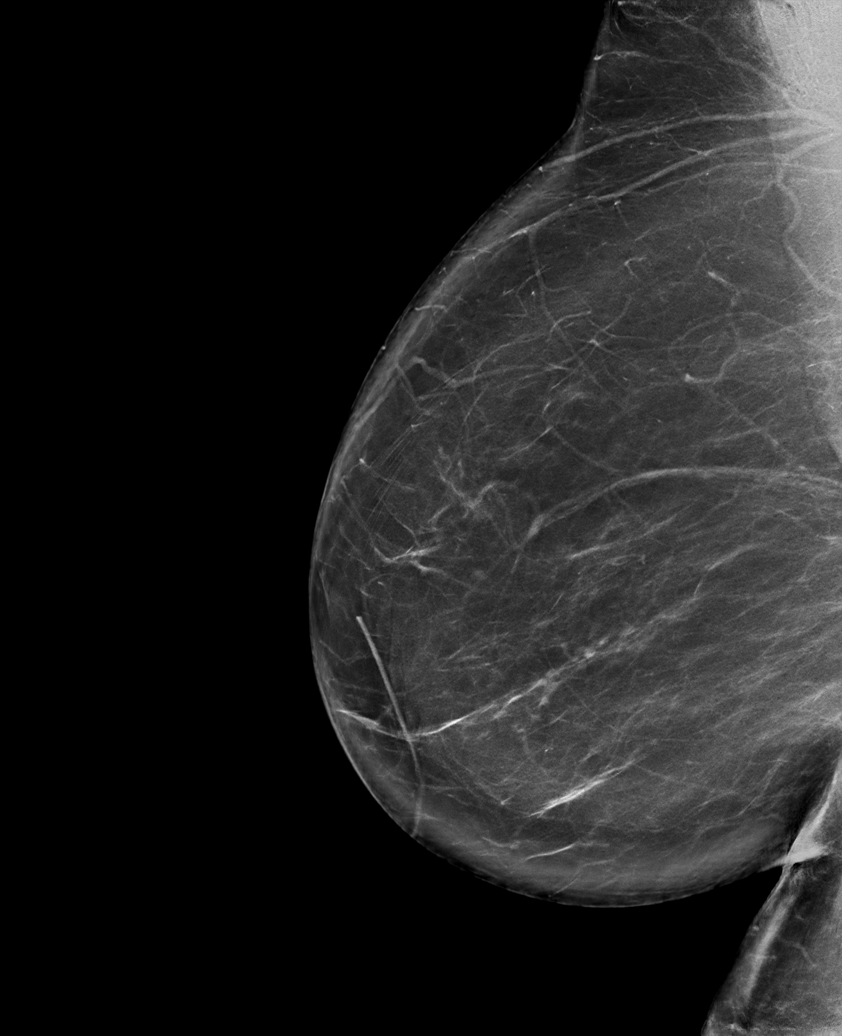

[L MLO synth-2D]
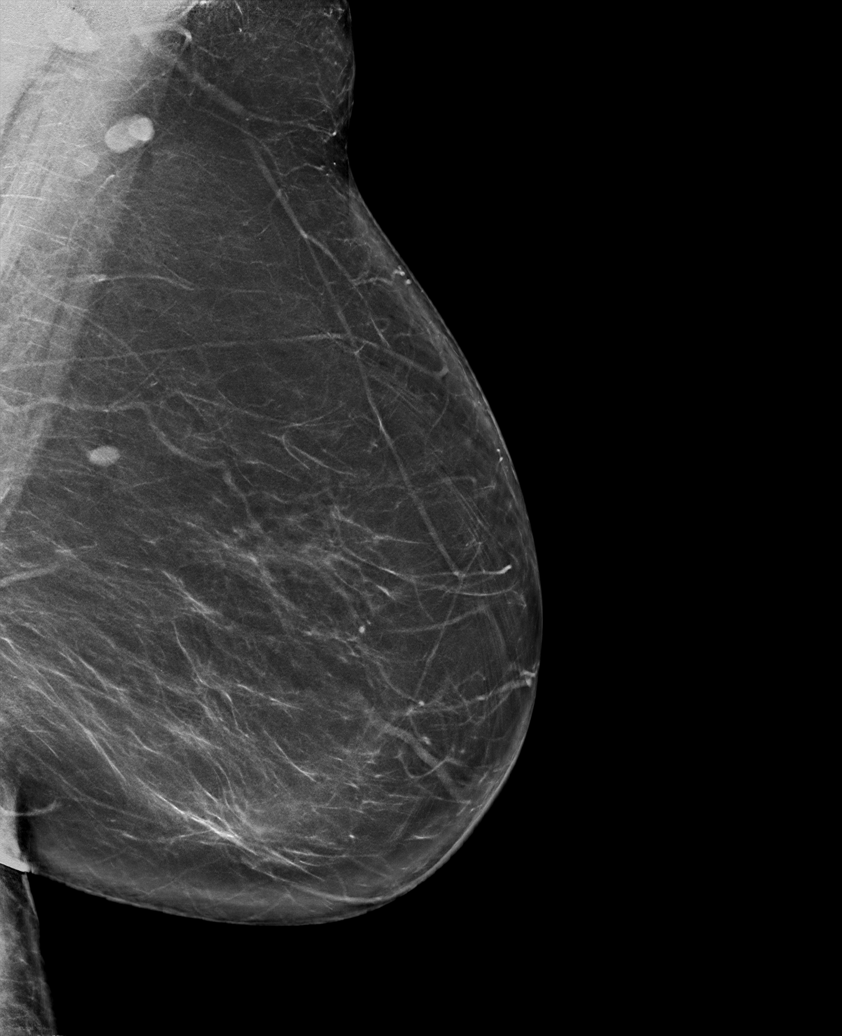

[R CC synth-2D (1 of 2)]
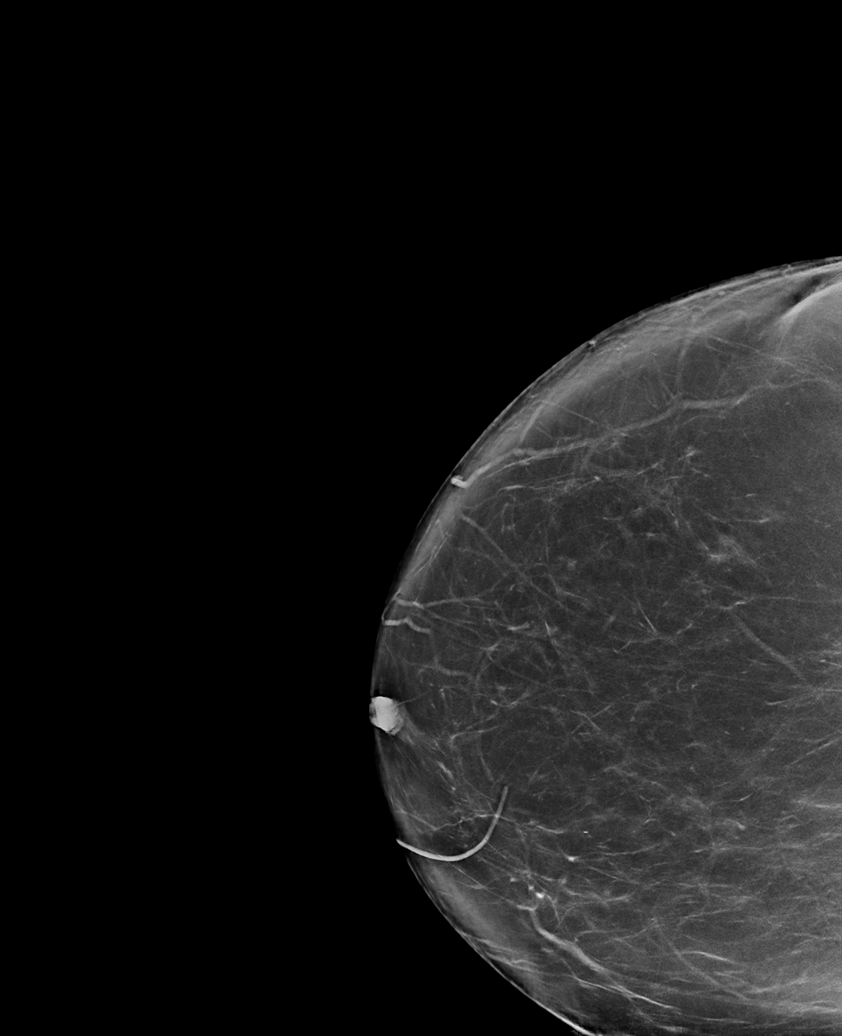

[R CC synth-2D (2 of 2)]
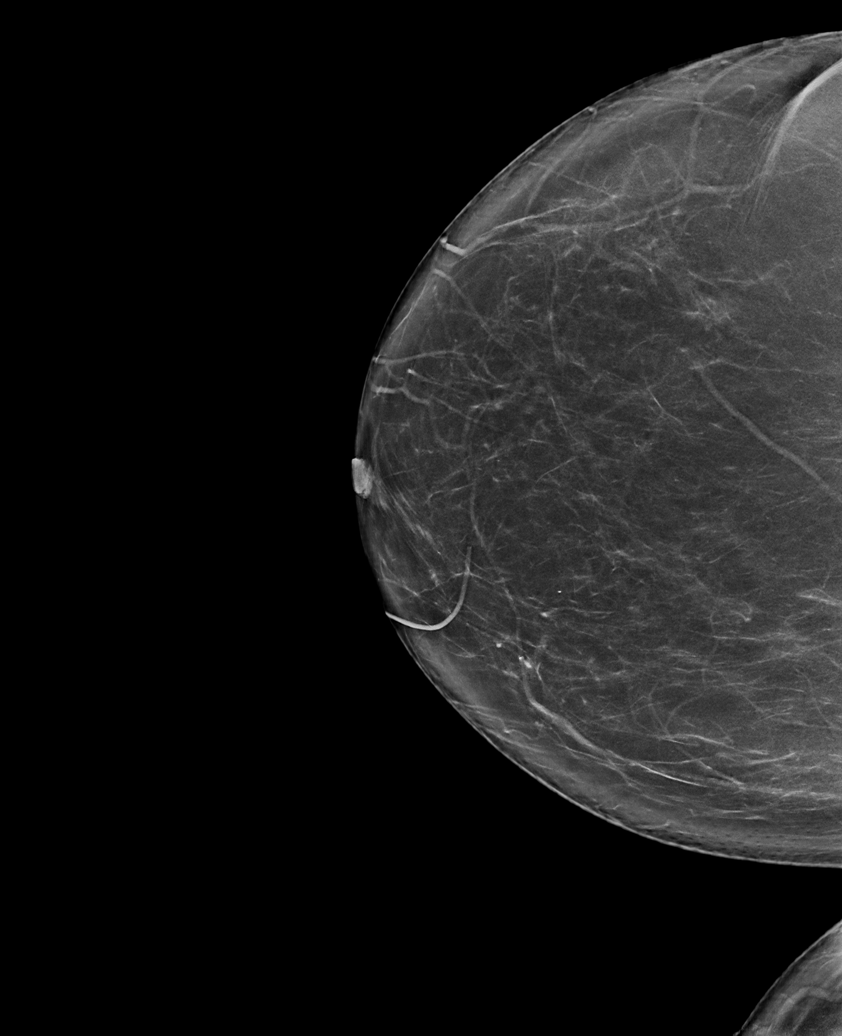

[L CC synth-2D]
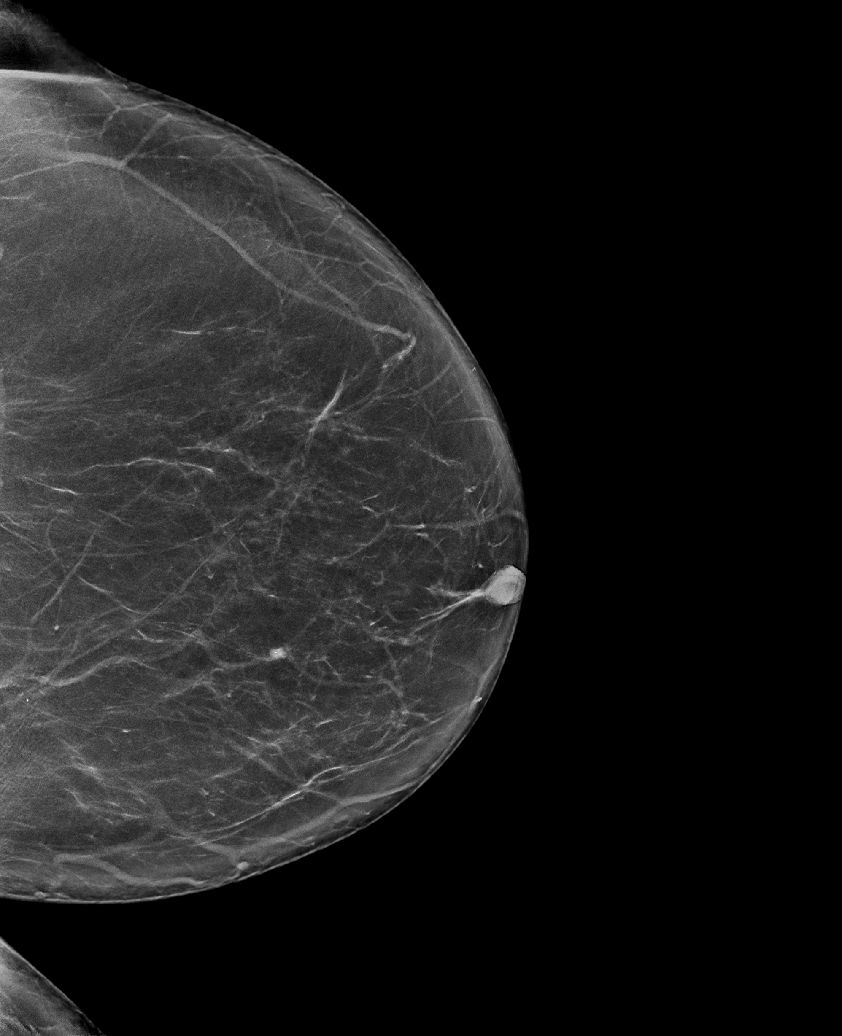

[L CC tomo · tomo slice 43/86.0]
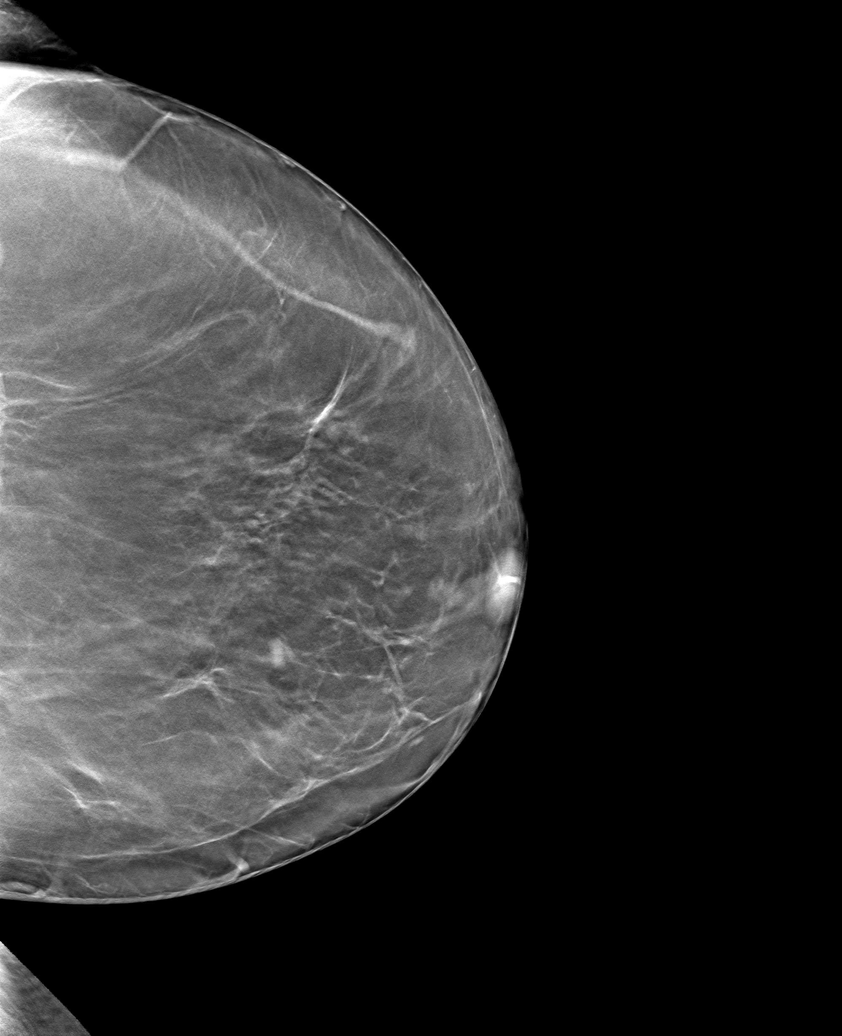

[6 of 30 positions shown; findings below may reference images not displayed]

ACR Breast Density Category b: There are scattered areas of
fibroglandular density.
FINDINGS: There are no findings suspicious for malignancy.
IMPRESSION: No mammographic evidence of malignancy. A result letter of this
screening mammogram will be mailed directly to the patient.

RECOMMENDATION:
Screening mammogram in one year. (Code:51-O-LD2)

BI-RADS CATEGORY  1: Negative.

## 2022-08-02 ENCOUNTER — Ambulatory Visit
Admission: RE | Admit: 2022-08-02 | Discharge: 2022-08-02 | Disposition: A | Discharge status: Home / Self Care | Payer: Medicare HMO | Source: Ambulatory Visit | Attending: Family Medicine | Admitting: Family Medicine

## 2022-08-02 DIAGNOSIS — Z1231 Encounter for screening mammogram for malignant neoplasm of breast: Secondary | ICD-10-CM | POA: Diagnosis present

## 2022-11-24 ENCOUNTER — Ambulatory Visit: Payer: Medicare HMO | Admitting: Physician Assistant

## 2022-11-24 VITALS — BP 150/69 | HR 84

## 2022-11-24 DIAGNOSIS — N958 Other specified menopausal and perimenopausal disorders: Secondary | ICD-10-CM | POA: Diagnosis not present

## 2022-11-24 DIAGNOSIS — R3129 Other microscopic hematuria: Secondary | ICD-10-CM | POA: Diagnosis not present

## 2022-11-24 DIAGNOSIS — R350 Frequency of micturition: Secondary | ICD-10-CM | POA: Diagnosis not present

## 2022-11-24 LAB — MICROSCOPIC EXAMINATION

## 2022-11-24 LAB — URINALYSIS, COMPLETE
Bilirubin, UA: NEGATIVE
Glucose, UA: NEGATIVE
Ketones, UA: NEGATIVE
Leukocytes,UA: NEGATIVE
Nitrite, UA: NEGATIVE
Protein,UA: NEGATIVE
Specific Gravity, UA: >1.03 — ABNORMAL HIGH (ref 1.005–1.030)
Urobilinogen, Ur: 0.2 mg/dL (ref 0.2–1.0)
pH, UA: 5 (ref 5.0–7.5)

## 2022-11-24 LAB — BLADDER SCAN AMB NON-IMAGING: Scan Result: 0

## 2022-11-24 MED ORDER — MIRABEGRON ER 50 MG PO TB24: 50.0000 mg | ORAL_TABLET | Freq: Every day | ORAL | 11 refills | Status: DC

## 2022-11-24 MED ORDER — PREMARIN 0.625 MG/GM VA CREA: TOPICAL_CREAM | VAGINAL | 4 refills | Status: DC

## 2022-11-24 NOTE — Progress Notes (Signed)
11/24/2022 12:50 PM   Alisha Henson [birth year] [id number]  CC: Chief Complaint  Patient presents with   Follow-up   HPI: Alisha Henson is a 72 y.o. female with PMH microscopic hematuria with benign workup in 2021 and OAB wet on Myrbetriq 50 mg daily who presents today for annual follow-up.   Today she reports intermittent Myrbetriq use.  She denies gross hematuria or dysuria.  She has noticed some vulvar burning with urination and vulvar itching that she thinks may have something to do with menopause.  She denies cottage cheese discharge.  She has been using an OTC vaginal moisturizer, which helps.  In-office UA today positive for 2+ blood; urine microscopy with 3-10 RBCs/HPF. PVR 0mL.  PMH: No past medical history on file.  Surgical History: Past Surgical History:  Procedure Laterality Date   BREAST EXCISIONAL BIOPSY Right 20+ yrs ago   neg   COLONOSCOPY WITH PROPOFOL N/A 09/03/2014   Procedure: COLONOSCOPY WITH PROPOFOL;  Surgeon: Scot Jun, MD;  Location: [facility] ENDOSCOPY;  Service: Endoscopy;  Laterality: N/A;    Home Medications:  Allergies as of 11/24/2022   No Known Allergies      Medication List        Accurate as of November 24, 2022 12:50 PM. If you have any questions, ask your nurse or doctor.          amLODipine 10 MG tablet Commonly known as: NORVASC   atorvastatin 20 MG tablet Commonly known as: LIPITOR Take 20 mg by mouth daily.   cetirizine 10 MG tablet Commonly known as: ZYRTEC Take 10 mg by mouth daily.   fluticasone 50 MCG/ACT nasal spray Commonly known as: FLONASE   lovastatin 20 MG tablet Commonly known as: MEVACOR   mirabegron ER 50 MG Tb24 tablet Commonly known as: Myrbetriq Take 1 tablet (50 mg total) by mouth daily.   naproxen 500 MG tablet Commonly known as: NAPROSYN Take 1 tablet twice a day by oral route.   Premarin vaginal cream Generic drug: conjugated estrogens Apply one pea-sized amount around the opening  of the urethra daily for 2 weeks, then 3 times weekly moving forward. What changed: additional instructions        Allergies:  No Known Allergies  Family History: Family History  Problem Relation Age of Onset   Breast cancer Neg Hx     Social History:   reports that she quit smoking about 44 years ago. Her smoking use included cigarettes. She has never used smokeless tobacco. She reports that she does not drink alcohol. No history on file for drug use.  Physical Exam: BP (!) 150/69   Pulse 84   Constitutional:  Alert and oriented, no acute distress, nontoxic appearing HEENT: [place], AT Cardiovascular: No clubbing, cyanosis, or edema Respiratory: Normal respiratory effort, no increased work of breathing Skin: No rashes, bruises or suspicious lesions Neurologic: Grossly intact, no focal deficits, moving all 4 extremities Psychiatric: Normal mood and affect  Laboratory Data: Results for orders placed or performed in visit on 11/24/22  Microscopic Examination   Urine  Result Value Ref Range   WBC, UA 0-5 0 - 5 /hpf   RBC, Urine 3-10 (A) 0 - 2 /hpf   Epithelial Cells (non renal) 0-10 0 - 10 /hpf   Mucus, UA Present (A) Not Estab.   Bacteria, UA Few None seen/Few  Urinalysis, Complete  Result Value Ref Range   Specific Gravity, UA >1.030 (H) 1.005 - 1.030   pH, UA  5.0 5.0 - 7.5   Color, UA Yellow Yellow   Appearance Ur Clear Clear   Leukocytes,UA Negative Negative   Protein,UA Negative Negative/Trace   Glucose, UA Negative Negative   Ketones, UA Negative Negative   RBC, UA 2+ (A) Negative   Bilirubin, UA Negative Negative   Urobilinogen, Ur 0.2 0.2 - 1.0 mg/dL   Nitrite, UA Negative Negative   Microscopic Examination See below:   BLADDER SCAN AMB NON-IMAGING  Result Value Ref Range   Scan Result 0 ml    Assessment & Plan:   1. Urinary frequency Well-controlled on Myrbetriq, will continue this.  She is emptying appropriately. - Urinalysis, Complete - BLADDER SCAN  AMB NON-IMAGING - mirabegron ER (MYRBETRIQ) 50 MG TB24 tablet; Take 1 tablet (50 mg total) by mouth daily.  Dispense: 30 tablet; Refill: 11  2. Microscopic hematuria Stable, no episodes of gross hematuria.  Will continue to monitor and consider repeat hematuria workup in the next 2 years. - Urinalysis, Complete - BLADDER SCAN AMB NON-IMAGING  3. Genitourinary syndrome of menopause Encouraged her to continue nonhormonal vaginal moisturizers as needed, but we also discussed the role of lost estrogen and GSM and I recommended starting topical vaginal estrogen cream.  We discussed that I expect this will also help with her OAB and help to prevent UTIs.  She is in agreement with this plan. - conjugated estrogens (PREMARIN) vaginal cream; Apply one pea-sized amount around the opening of the urethra daily for 2 weeks, then 3 times weekly moving forward.  Dispense: 30 g; Refill: 4   Return in about 1 year (around 11/24/2023) for Annual f/u with PVR, UA.  Carman Ching, PA-C  [facility] [place] [address] [place], [place] [address] [phone]

## 2022-11-24 NOTE — Patient Instructions (Signed)
Continue Myrbetriq daily. Start vaginal estrogen cream. Apply a pea-sized amount around the opening of the urethra every day for 2 weeks, then three times weekly forever.

## 2023-02-28 ENCOUNTER — Telehealth: Payer: Self-pay | Admitting: Physician Assistant

## 2023-02-28 NOTE — Telephone Encounter (Signed)
 She has been on Myrbetriq for at least 2 years and previously reported tolerating it well. Is this dryness new? If so, it's unlikely due to a chronic medication. Have there been any other recent changes to her medications?

## 2023-02-28 NOTE — Telephone Encounter (Signed)
 Pt called requesting to discontinue RX for Myrbetriq 50 mg. Pt states that she feels very dry and has dry mouth when taking this medication. Pt also states that she is scared to continue these meds. Please advise patient.

## 2023-03-01 NOTE — Telephone Encounter (Signed)
 Pt states she has been on/off Myrbetriq  for the last 2 years. She stopped it on Sunday and states her dry mouth is better.  She states her hands are very dry. Advised pt  that due to weather her hands may be more dry. At this time she does not want to take any meds for OAB. She will call us  back if she changes her mind.

## 2023-04-13 ENCOUNTER — Other Ambulatory Visit: Payer: Self-pay | Admitting: Family Medicine

## 2023-04-13 ENCOUNTER — Ambulatory Visit
Admission: RE | Admit: 2023-04-13 | Discharge: 2023-04-13 | Disposition: A | Discharge status: Home / Self Care | Payer: Medicare HMO | Source: Ambulatory Visit | Attending: Family Medicine | Admitting: Family Medicine

## 2023-04-13 DIAGNOSIS — M1712 Unilateral primary osteoarthritis, left knee: Secondary | ICD-10-CM | POA: Insufficient documentation

## 2023-04-13 DIAGNOSIS — M25561 Pain in right knee: Secondary | ICD-10-CM

## 2023-05-31 ENCOUNTER — Encounter: Payer: Self-pay | Admitting: Physician Assistant

## 2023-05-31 ENCOUNTER — Ambulatory Visit (INDEPENDENT_AMBULATORY_CARE_PROVIDER_SITE_OTHER): Admitting: Physician Assistant

## 2023-05-31 VITALS — BP 143/79 | HR 68 | Ht 66.0 in | Wt 208.0 lb

## 2023-05-31 DIAGNOSIS — R350 Frequency of micturition: Secondary | ICD-10-CM | POA: Diagnosis not present

## 2023-05-31 DIAGNOSIS — N958 Other specified menopausal and perimenopausal disorders: Secondary | ICD-10-CM

## 2023-05-31 DIAGNOSIS — R3129 Other microscopic hematuria: Secondary | ICD-10-CM

## 2023-05-31 LAB — URINALYSIS, COMPLETE
Bilirubin, UA: NEGATIVE
Glucose, UA: NEGATIVE
Ketones, UA: NEGATIVE
Leukocytes,UA: NEGATIVE
Nitrite, UA: NEGATIVE
Protein,UA: NEGATIVE
Specific Gravity, UA: 1.02 (ref 1.005–1.030)
Urobilinogen, Ur: 0.2 mg/dL (ref 0.2–1.0)
pH, UA: 7 (ref 5.0–7.5)

## 2023-05-31 LAB — MICROSCOPIC EXAMINATION: Bacteria, UA: NONE SEEN

## 2023-05-31 LAB — BLADDER SCAN AMB NON-IMAGING

## 2023-05-31 MED ORDER — MIRABEGRON ER 50 MG PO TB24
50.0000 mg | ORAL_TABLET | Freq: Every day | ORAL | 11 refills | Status: DC
Start: 2023-05-31 — End: 2023-07-12

## 2023-05-31 MED ORDER — ESTRADIOL 0.1 MG/GM VA CREA
TOPICAL_CREAM | VAGINAL | 12 refills | Status: DC
Start: 2023-05-31 — End: 2023-07-12

## 2023-05-31 NOTE — Progress Notes (Signed)
 05/31/2023 10:19 AM   Alisha Henson [birth year] [id number]  CC: Chief Complaint  Patient presents with   Other    Discuss med    HPI: Alisha Henson is a 73 y.o. female with PMH microscopic hematuria with benign workup in 2021, GSM, and OAB wet previously on Myrbetriq 50 mg daily who presents today for follow-up.   She called our clinic earlier this year to request stopping Myrbetriq due to dry mouth and dry skin.  At that time, she reported that her dry mouth had significantly improved after stopping the Myrbetriq for 2 days.  Today she reports she does not think the Myrbetriq was the source of her dry mouth and would like to resume it.  She describes stable, chronic urinary urgency, leakage without awareness, and nocturia x 3.  She had a twinge of right flank pain last week but denies fever, chills, nausea, or vomiting.  She has not been using estrogen cream due to cost but describes continued vaginal dryness, irritation, and itching.  PMH: No past medical history on file.  Surgical History: Past Surgical History:  Procedure Laterality Date   BREAST EXCISIONAL BIOPSY Right 20+ yrs ago   neg   COLONOSCOPY WITH PROPOFOL N/A 09/03/2014   Procedure: COLONOSCOPY WITH PROPOFOL;  Surgeon: Scot Jun, MD;  Location: [facility] ENDOSCOPY;  Service: Endoscopy;  Laterality: N/A;    Home Medications:  Allergies as of 05/31/2023   No Known Allergies      Medication List        Accurate as of May 31, 2023 10:19 AM. If you have any questions, ask your nurse or doctor.          STOP taking these medications    Premarin vaginal cream Generic drug: conjugated estrogens Stopped by: Carman Ching       TAKE these medications    amLODipine 10 MG tablet Commonly known as: NORVASC   atorvastatin 20 MG tablet Commonly known as: LIPITOR Take 20 mg by mouth daily.   cetirizine 10 MG tablet Commonly known as: ZYRTEC Take 10 mg by mouth daily.   estradiol 0.1  MG/GM vaginal cream Commonly known as: ESTRACE Apply one pea-sized amount around the opening of the urethra daily for 2 weeks, then 3 times weekly moving forward. Started by: Carman Ching   fluticasone 50 MCG/ACT nasal spray Commonly known as: FLONASE   lovastatin 20 MG tablet Commonly known as: MEVACOR   mirabegron ER 50 MG Tb24 tablet Commonly known as: Myrbetriq Take 1 tablet (50 mg total) by mouth daily.   naproxen 500 MG tablet Commonly known as: NAPROSYN Take 1 tablet twice a day by oral route.        Allergies:  No Known Allergies  Family History: Family History  Problem Relation Age of Onset   Breast cancer Neg Hx     Social History:   reports that she quit smoking about 44 years ago. Her smoking use included cigarettes. She has never used smokeless tobacco. She reports that she does not drink alcohol. No history on file for drug use.  Physical Exam: BP (!) 143/79   Pulse 68   Ht 5\' 6"  (1.676 m)   Wt 208 lb (94.3 kg)   BMI 33.57 kg/m   Constitutional:  Alert and oriented, no acute distress, nontoxic appearing HEENT: [place], AT Cardiovascular: No clubbing, cyanosis, or edema Respiratory: Normal respiratory effort, no increased work of breathing Skin: No rashes, bruises or suspicious lesions Neurologic: Grossly intact,  no focal deficits, moving all 4 extremities Psychiatric: Normal mood and affect  Laboratory Data: Results for orders placed or performed in visit on 05/31/23  Microscopic Examination   Collection Time: 05/31/23 10:36 AM   Urine  Result Value Ref Range   WBC, UA 0-5 0 - 5 /hpf   RBC, Urine 3-10 (A) 0 - 2 /hpf   Epithelial Cells (non renal) 0-10 0 - 10 /hpf   Bacteria, UA None seen None seen/Few  Urinalysis, Complete   Collection Time: 05/31/23 10:36 AM  Result Value Ref Range   Specific Gravity, UA 1.020 1.005 - 1.030   pH, UA 7.0 5.0 - 7.5   Color, UA Yellow Yellow   Appearance Ur Clear Clear   Leukocytes,UA Negative  Negative   Protein,UA Negative Negative/Trace   Glucose, UA Negative Negative   Ketones, UA Negative Negative   RBC, UA Trace (A) Negative   Bilirubin, UA Negative Negative   Urobilinogen, Ur 0.2 0.2 - 1.0 mg/dL   Nitrite, UA Negative Negative   Microscopic Examination See below:   BLADDER SCAN AMB NON-IMAGING   Collection Time: 05/31/23 10:39 AM  Result Value Ref Range   Scan Result 0ml    Assessment & Plan:   1. Urinary frequency Chronic, stable.  Will renew her Myrbetriq 50 mg prescription and see her back in 6 weeks.  If she has dry mouth after resuming it, may try Gemtesa as an alternative.  If unable to tolerate beta 3 agonist due to dry mouth, I do not think she will be able to tolerate antimuscarinics.  We may need to consider third line therapies.  Will check a UA today to rule out infection.  If worsening microscopic hematuria over baseline, may consider imaging to rule out underlying stone episode given her recent reports of right flank discomfort. - mirabegron ER (MYRBETRIQ) 50 MG TB24 tablet; Take 1 tablet (50 mg total) by mouth daily.  Dispense: 30 tablet; Refill: 11 - Urinalysis, Complete - BLADDER SCAN AMB NON-IMAGING  2. Genitourinary syndrome of menopause Will send in generic estradiol cream as an alternative Premarin.  We discussed how to apply this at the urethral meatus. - estradiol (ESTRACE) 0.1 MG/GM vaginal cream; Apply one pea-sized amount around the opening of the urethra daily for 2 weeks, then 3 times weekly moving forward.  Dispense: 42.5 g; Refill: 12  Return in about 6 weeks (around 07/12/2023) for Symptom recheck with PVR.  Kathreen Pare, PA-C  [facility] [place] [address] [place], [place] [address] [phone]

## 2023-05-31 NOTE — Addendum Note (Signed)
 Addended by: Pryce Folts P on: 05/31/2023 05:06 PM   Modules accepted: Orders

## 2023-06-13 ENCOUNTER — Ambulatory Visit
Admission: RE | Admit: 2023-06-13 | Discharge: 2023-06-13 | Disposition: A | Discharge status: Home / Self Care | Source: Ambulatory Visit | Attending: Physician Assistant | Admitting: Physician Assistant

## 2023-06-13 DIAGNOSIS — R3129 Other microscopic hematuria: Secondary | ICD-10-CM | POA: Diagnosis present

## 2023-06-30 ENCOUNTER — Other Ambulatory Visit: Payer: Self-pay | Admitting: Family Medicine

## 2023-06-30 DIAGNOSIS — Z1231 Encounter for screening mammogram for malignant neoplasm of breast: Secondary | ICD-10-CM

## 2023-07-12 ENCOUNTER — Ambulatory Visit (INDEPENDENT_AMBULATORY_CARE_PROVIDER_SITE_OTHER): Admitting: Physician Assistant

## 2023-07-12 VITALS — BP 147/77 | HR 80 | Ht 66.0 in | Wt 210.0 lb

## 2023-07-12 DIAGNOSIS — R3129 Other microscopic hematuria: Secondary | ICD-10-CM | POA: Diagnosis not present

## 2023-07-12 DIAGNOSIS — R350 Frequency of micturition: Secondary | ICD-10-CM | POA: Diagnosis not present

## 2023-07-12 DIAGNOSIS — N958 Other specified menopausal and perimenopausal disorders: Secondary | ICD-10-CM

## 2023-07-12 LAB — BLADDER SCAN AMB NON-IMAGING

## 2023-07-12 MED ORDER — MIRABEGRON ER 50 MG PO TB24: 50.0000 mg | ORAL_TABLET | Freq: Every day | ORAL | 11 refills | Status: AC

## 2023-07-12 MED ORDER — ESTRADIOL 0.1 MG/GM VA CREA: TOPICAL_CREAM | VAGINAL | 12 refills | Status: AC

## 2023-07-12 NOTE — Progress Notes (Signed)
 07/12/2023 10:36 AM   Dorreen BRODY KUMP 26-Nov-1950 [id number]  CC: Chief Complaint  Patient presents with   Urinary Frequency   HPI: Alisha Henson is a 73 y.o. 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   Today she reports significant improvement in urgency, frequency, urinary leakage, and vulvovaginal dryness/irritation after resuming Myrbetriq  and estrogen cream.  She is tolerating the Myrbetriq  without dry mouth.  She is very pleased on this regimen.  She does notice some worsening urgency/frequency after her morning coffee.  She had a renal ultrasound on 06/13/2023, with no shadowing stones or hydronephrosis.  PVR 0mL.  PMH: No past medical history on file.  Surgical History: Past Surgical History:  Procedure Laterality Date   BREAST EXCISIONAL BIOPSY Right 20+ yrs ago   neg   COLONOSCOPY WITH PROPOFOL  N/A 09/03/2014   Procedure: COLONOSCOPY WITH PROPOFOL ;  Surgeon: Cassie Click, MD;  Location: [facility] ENDOSCOPY;  Service: Endoscopy;  Laterality: N/A;    Home Medications:  Allergies as of 07/12/2023   No Known Allergies      Medication List        Accurate as of Jul 12, 2023 10:36 AM. If you have any questions, ask your nurse or doctor.          amLODipine 10 MG tablet Commonly known as: NORVASC   atorvastatin 20 MG tablet Commonly known as: LIPITOR Take 20 mg by mouth daily.   cetirizine 10 MG tablet Commonly known as: ZYRTEC Take 10 mg by mouth daily.   estradiol  0.1 MG/GM vaginal cream Commonly known as: ESTRACE  Apply one pea-sized amount around the opening of the urethra daily for 2 weeks, then 3 times weekly moving forward.   fluticasone 50 MCG/ACT nasal spray Commonly known as: FLONASE   lovastatin 20 MG tablet Commonly known as: MEVACOR   mirabegron  ER 50 MG Tb24 tablet Commonly known as:  Myrbetriq  Take 1 tablet (50 mg total) by mouth daily.   naproxen 500 MG tablet Commonly known as: NAPROSYN Take 1 tablet twice a day by oral route.        Allergies:  No Known Allergies  Family History: Family History  Problem Relation Age of Onset   Breast cancer Neg Hx     Social History:   reports that she quit smoking about 44 years ago. Her smoking use included cigarettes. She has never used smokeless tobacco. She reports that she does not drink alcohol. No history on file for drug use.  Physical Exam: There were no vitals taken for this visit.  Constitutional:  Alert and oriented, no acute distress, nontoxic appearing HEENT: [place], AT Cardiovascular: No clubbing, cyanosis, or edema Respiratory: Normal respiratory effort, no increased work of breathing Skin: No rashes, bruises or suspicious lesions Neurologic: Grossly intact, no focal deficits, moving all 4 extremities Psychiatric: Normal mood and affect  Laboratory Data: Results for orders placed or performed in visit on 07/12/23  BLADDER SCAN AMB NON-IMAGING   Collection Time: 07/12/23 10:44 AM  Result Value Ref Range   Scan Result 0ml    Assessment & Plan:   1. Urinary frequency (Primary) Significant symptomatic improvement on Myrbetriq  50 mg, which she is tolerating well.  She is emptying appropriately.  Will plan to continue this. - BLADDER SCAN AMB NON-IMAGING - mirabegron  ER (MYRBETRIQ ) 50 MG TB24 tablet; Take 1 tablet (50 mg  total) by mouth daily.  Dispense: 30 tablet; Refill: 11  2. Genitourinary syndrome of menopause Significant improvement on topical vaginal estrogen cream, will continue this. - estradiol  (ESTRACE ) 0.1 MG/GM vaginal cream; Apply one pea-sized amount around the opening of the urethra daily for 2 weeks, then 3 times weekly moving forward.  Dispense: 42.5 g; Refill: 12   3. Microscopic hematuria No significant findings on recent renal ultrasound.  Will continue to monitor.  Return in  about 1 year (around 07/11/2024) for Annual UA, PVR.  Kathreen Pare, PA-C  [facility]  [address] [place], [place] [address] [phone]

## 2023-08-03 ENCOUNTER — Ambulatory Visit
Admission: RE | Admit: 2023-08-03 | Discharge: 2023-08-03 | Disposition: A | Discharge status: Home / Self Care | Source: Ambulatory Visit | Attending: Family Medicine | Admitting: Family Medicine

## 2023-08-03 DIAGNOSIS — Z1231 Encounter for screening mammogram for malignant neoplasm of breast: Secondary | ICD-10-CM | POA: Insufficient documentation

## 2023-10-17 ENCOUNTER — Telehealth: Payer: Self-pay

## 2023-10-17 NOTE — Telephone Encounter (Signed)
 Pt had called triage line with complaints of smelly dark urine. No pain or burning. Since triage call pt had pushed fluids and that has clear up her urine symptoms. Pt was instructed that if she started having UTI symptoms with burning, frequency or pain please seek attention at urgent care or her PCP. Pt stated understanding.

## 2023-11-23 ENCOUNTER — Ambulatory Visit: Payer: Self-pay | Admitting: Physician Assistant

## 2023-11-23 ENCOUNTER — Encounter: Payer: Self-pay | Admitting: Physician Assistant

## 2023-11-23 VITALS — BP 134/79 | HR 74 | Ht 66.0 in | Wt 205.0 lb

## 2023-11-23 DIAGNOSIS — R350 Frequency of micturition: Secondary | ICD-10-CM | POA: Diagnosis not present

## 2023-11-23 LAB — MICROSCOPIC EXAMINATION: Bacteria, UA: NONE SEEN

## 2023-11-23 LAB — URINALYSIS, COMPLETE
Bilirubin, UA: NEGATIVE
Glucose, UA: NEGATIVE
Ketones, UA: NEGATIVE
Leukocytes,UA: NEGATIVE
Nitrite, UA: NEGATIVE
Protein,UA: NEGATIVE
Specific Gravity, UA: <1.005 — ABNORMAL LOW (ref 1.005–1.030)
Urobilinogen, Ur: 0.2 mg/dL (ref 0.2–1.0)
pH, UA: 6 (ref 5.0–7.5)

## 2023-11-23 LAB — BLADDER SCAN AMB NON-IMAGING

## 2023-11-23 NOTE — Progress Notes (Signed)
 11/23/2023 10:18 AM   Alisha Henson November 17, 1950 [id number]  CC: Chief Complaint  Patient presents with   Follow-up   HPI: Alisha Henson is a 73 y.o. female with PMH microscopic hematuria with benign workup in 2021, GSM on estrogen cream, and OAB wet on Myrbetriq  50 mg who presents today for routine follow-up.   Today she reports she had some dark urine and her provider at [facility] encouraged her to start drinking more water.  She is now drinking 3 to 4 x 16 ounce bottles of water daily and her urine has cleared considerably.  She has a had nocturia x 3 with this and wonders if it could be related.  She goes to bed around 10:30 PM, and drinks her last bottle of water at 9:30 PM.  In-office UA today positive for trace intact blood; urine microscopy pan negative.  PVR 0 mL.SABRA  PMH: No past medical history on file.  Surgical History: Past Surgical History:  Procedure Laterality Date   BREAST EXCISIONAL BIOPSY Right 20+ yrs ago   neg   COLONOSCOPY WITH PROPOFOL  N/A 09/03/2014   Procedure: COLONOSCOPY WITH PROPOFOL ;  Surgeon: Lamar ONEIDA Holmes, MD;  Location: [facility] ENDOSCOPY;  Service: Endoscopy;  Laterality: N/A;    Home Medications:  Allergies as of 11/23/2023   No Known Allergies      Medication List        Accurate as of November 23, 2023 10:18 AM. If you have any questions, ask your nurse or doctor.          amLODipine 10 MG tablet Commonly known as: NORVASC   atorvastatin 20 MG tablet Commonly known as: LIPITOR Take 20 mg by mouth daily.   cetirizine 10 MG tablet Commonly known as: ZYRTEC Take 10 mg by mouth daily.   estradiol  0.1 MG/GM vaginal cream Commonly known as: ESTRACE  Apply one pea-sized amount around the opening of the urethra daily for 2 weeks, then 3 times weekly moving forward.   fluticasone 50 MCG/ACT nasal spray Commonly known as: FLONASE   lovastatin 20 MG tablet Commonly known as: MEVACOR   mirabegron  ER 50 MG Tb24 tablet Commonly  known as: Myrbetriq  Take 1 tablet (50 mg total) by mouth daily.   naproxen 500 MG tablet Commonly known as: NAPROSYN Take 1 tablet twice a day by oral route.        Allergies:  No Known Allergies  Family History: Family History  Problem Relation Age of Onset   Breast cancer Neg Hx     Social History:   reports that she quit smoking about 45 years ago. Her smoking use included cigarettes. She has never used smokeless tobacco. She reports that she does not drink alcohol. No history on file for drug use.  Physical Exam: BP 134/79   Pulse 74   Ht 5' 6 (1.676 m)   Wt 205 lb (93 kg)   BMI 33.09 kg/m   Constitutional:  Alert and oriented, no acute distress, nontoxic appearing HEENT: [place], AT Cardiovascular: No clubbing, cyanosis, or edema Respiratory: Normal respiratory effort, no increased work of breathing Skin: No rashes, bruises or suspicious lesions Neurologic: Grossly intact, no focal deficits, moving all 4 extremities Psychiatric: Normal mood and affect  Laboratory Data: Results for orders placed or performed in visit on 11/23/23  BLADDER SCAN AMB NON-IMAGING   Collection Time: 11/23/23 10:20 AM  Result Value Ref Range   Scan Result 0ml    Assessment & Plan:   1. Urinary  frequency (Primary) Largely well-controlled on Myrbetriq  50 mg, will continue this.  I think her nocturia is because she is consuming her last bottle of water right before bed.  I encouraged her to drink her waters earlier during the day, and finish her last water by around 7:30 PM, voiding 1 final time to fully empty her bladder right before she goes to sleep. - Urinalysis, Complete - BLADDER SCAN AMB NON-IMAGING  Return in about 1 year (around 11/22/2024) for Annual OAB f/u with PVR.  Lucie Hones, PA-C  [facility] [place] [address] [place], [place] [address] [phone]

## 2024-07-11 ENCOUNTER — Ambulatory Visit: Admitting: Physician Assistant

## 2024-11-22 ENCOUNTER — Ambulatory Visit: Admitting: Physician Assistant
